# Patient Record
Sex: Female | Born: 1960 | Race: Black or African American | Hispanic: No | Marital: Married | Smoking: Never smoker
Health system: Southern US, Community
[De-identification: ages and names within clinical notes are randomized; demographics above are authoritative.]

## PROBLEM LIST (undated history)

## (undated) DIAGNOSIS — I10 Essential (primary) hypertension: Secondary | ICD-10-CM

## (undated) HISTORY — DX: Essential (primary) hypertension: I10

## (undated) HISTORY — PX: NO PAST SURGERIES: SHX2092

---

## 1999-07-06 ENCOUNTER — Other Ambulatory Visit: Admission: RE | Admit: 1999-07-06 | Discharge: 1999-07-06 | Payer: Self-pay | Admitting: Obstetrics & Gynecology

## 2002-04-05 ENCOUNTER — Other Ambulatory Visit: Admission: RE | Admit: 2002-04-05 | Discharge: 2002-04-05 | Payer: Self-pay | Admitting: Obstetrics & Gynecology

## 2003-08-04 ENCOUNTER — Other Ambulatory Visit: Admission: RE | Admit: 2003-08-04 | Discharge: 2003-08-04 | Payer: Self-pay | Admitting: Obstetrics & Gynecology

## 2004-06-30 ENCOUNTER — Ambulatory Visit (HOSPITAL_COMMUNITY): Admission: RE | Admit: 2004-06-30 | Discharge: 2004-06-30 | Payer: Self-pay | Admitting: Obstetrics & Gynecology

## 2004-11-23 ENCOUNTER — Other Ambulatory Visit: Admission: RE | Admit: 2004-11-23 | Discharge: 2004-11-23 | Payer: Self-pay | Admitting: Obstetrics & Gynecology

## 2006-02-08 ENCOUNTER — Other Ambulatory Visit: Admission: RE | Admit: 2006-02-08 | Discharge: 2006-02-08 | Payer: Self-pay | Admitting: Obstetrics & Gynecology

## 2006-03-29 IMAGING — US US OB COMP LESS 14 WK
1 series · 18 of 28 positions shown · non-contrast
Comparison: none

CLINICAL DATA: 43-year-old with LMP in mid [place].  
EARLY OBSTETRICAL ULTRASOUND WITH TRANSVAGINAL:
Transabdominal and endovaginal images are performed of the pelvis.
Within the uterus there is a gestational sac with a mean sac diameter of 12.8 mm.  This correlates with an age of 6 weeks 0 days.  No yolk sac or embryo are identified at this time.  Small areas of subchorionic hemorrhage are identified.
Right ovary is 2.4 x 1.2 x 1.7 cm.  The left ovary is 1.4 x 2.4 x 1.4 cm.  No free pelvic fluid is identified.  
IMPRESSION
Intrauterine gestational sac with mean sac diameter of 12.8 mm.  indings are suspicious for an anembryonic pregnancy.  Follow-up ultrasound in one week may be helpful.

[Series 1: us ob comp<14 wk · 18 of 43 slices shown]
[im 1/43]
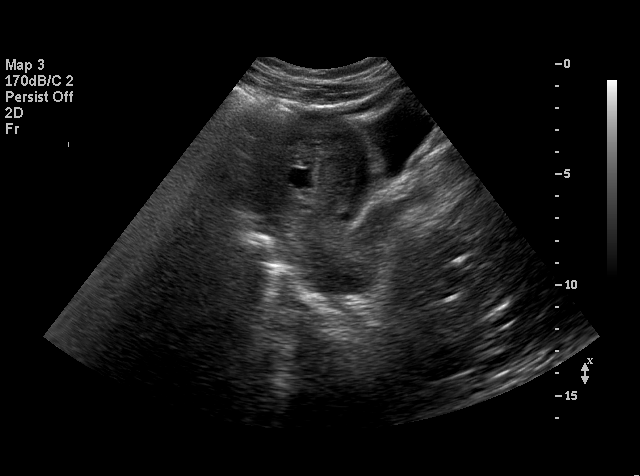
[im 4/43]
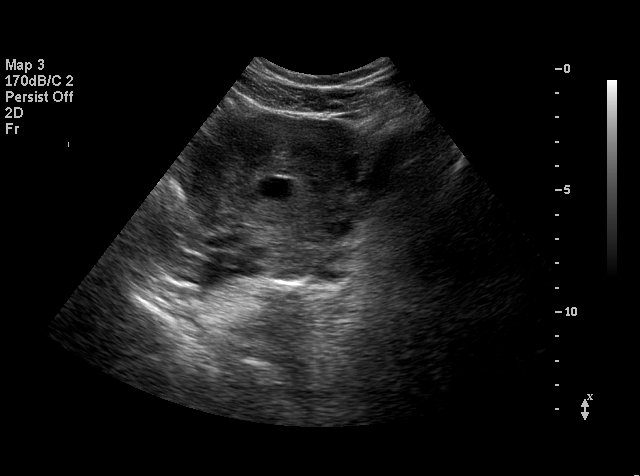
[im 5/43]
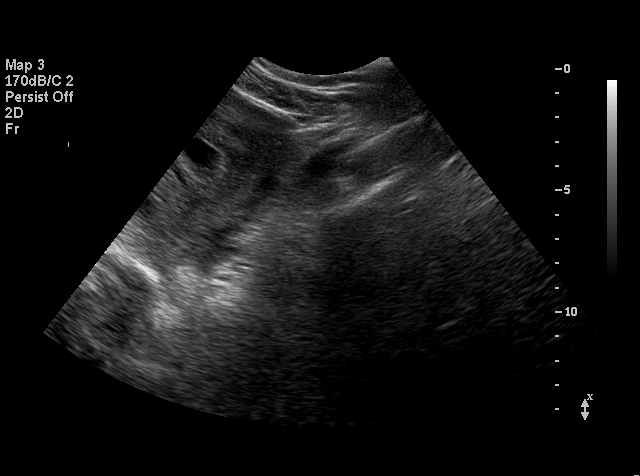
[im 8/43]
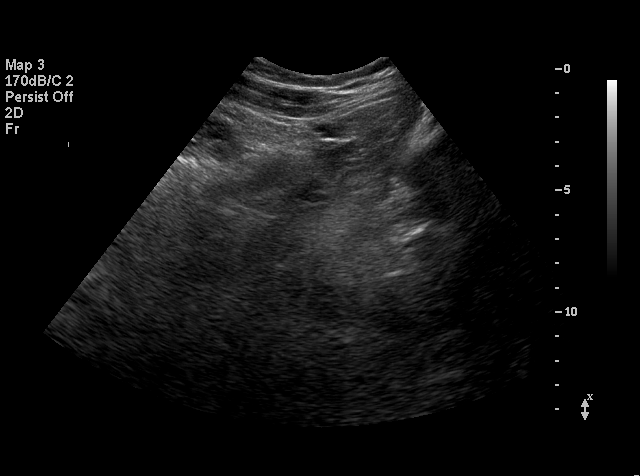
[im 11/43]
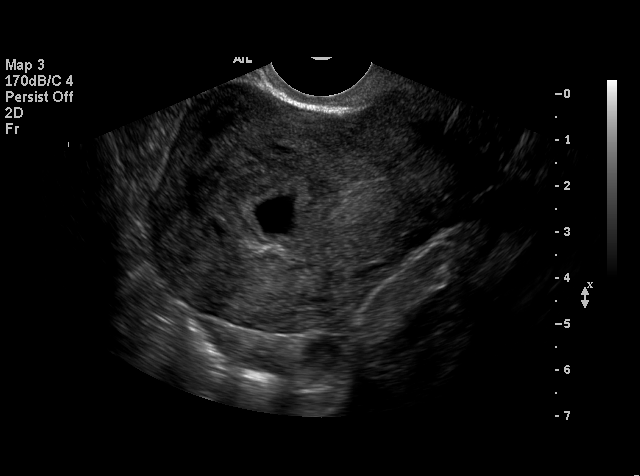
[im 13/43]
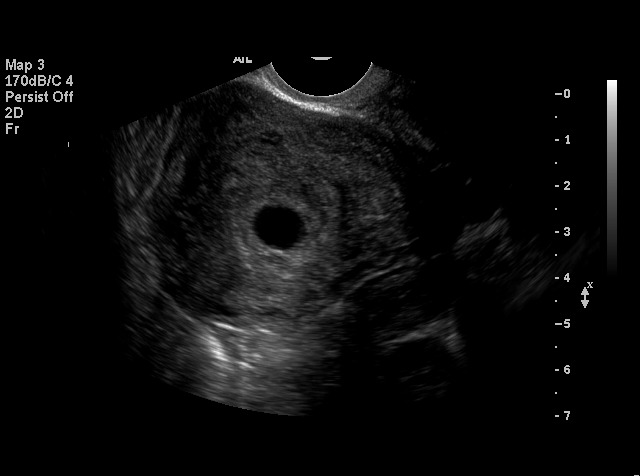
[im 16/43]
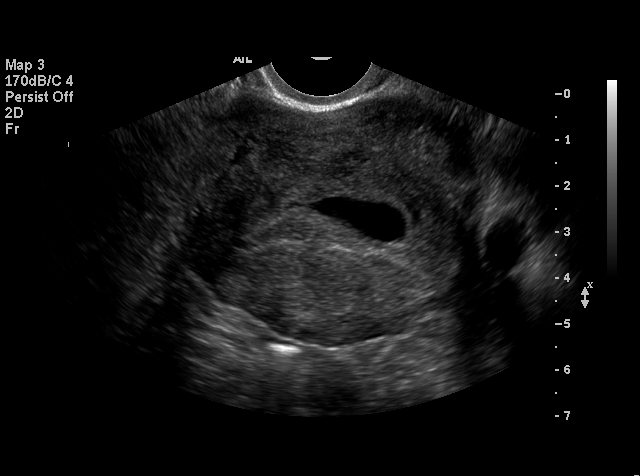
[im 18/43]
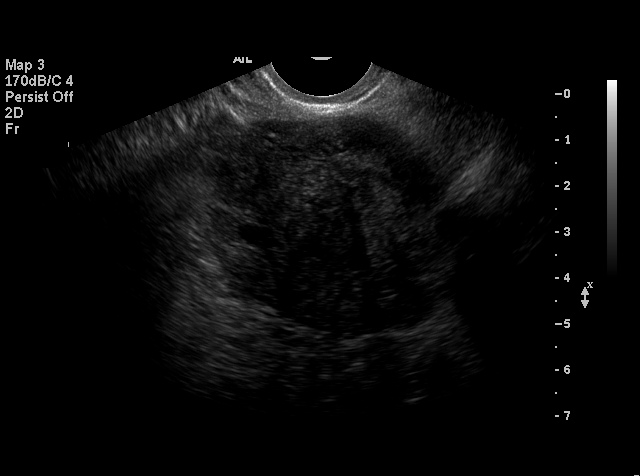
[im 21/43]
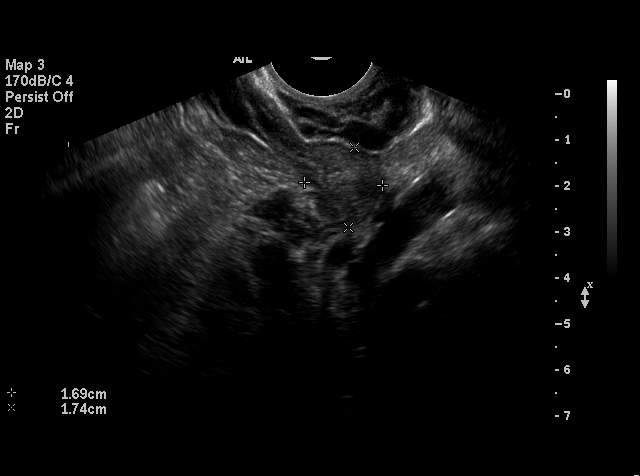
[im 22/43]
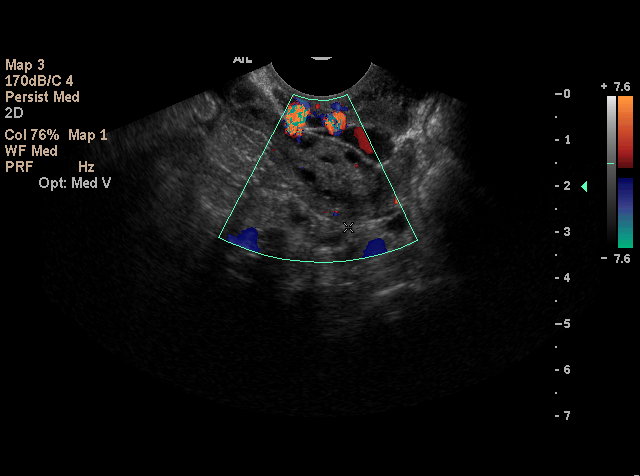
[im 25/43]
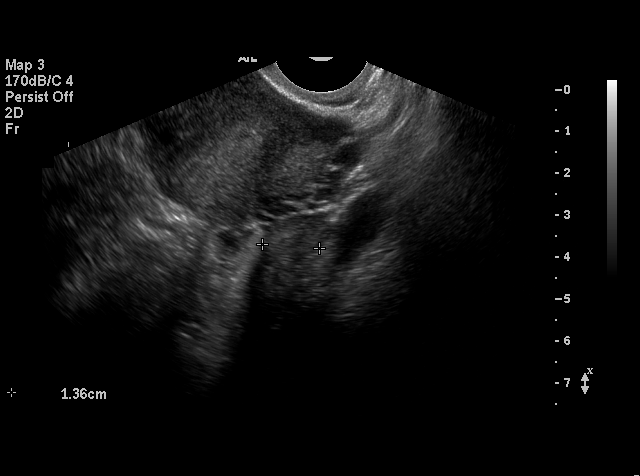
[im 27/43]
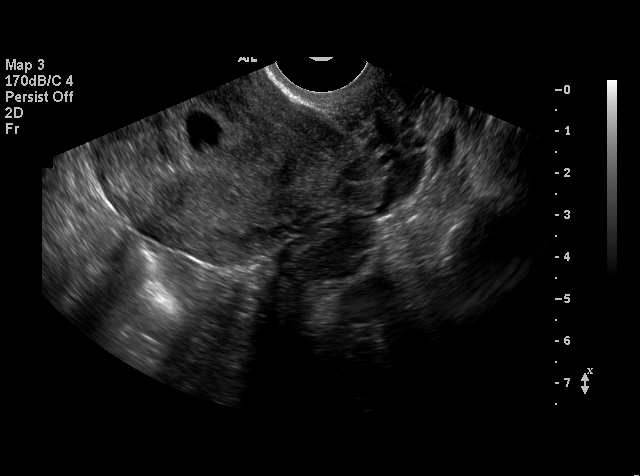
[im 30/43]
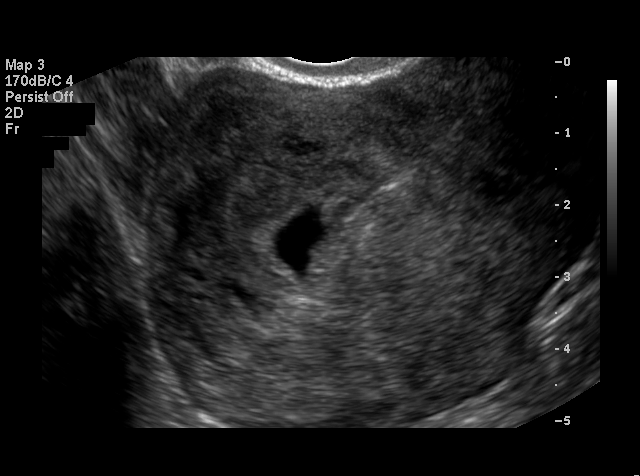
[im 33/43]
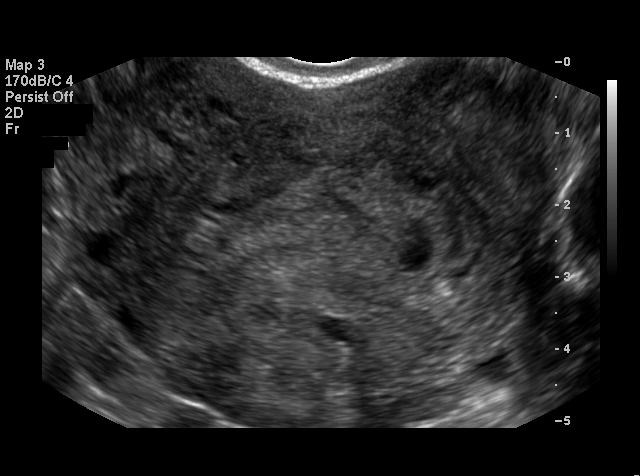
[im 35/43]
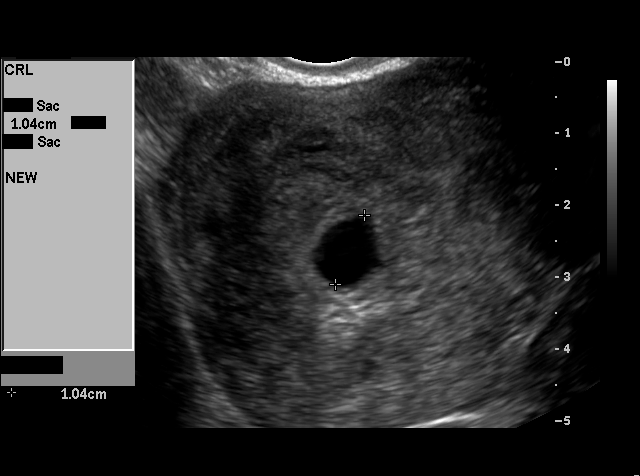
[im 38/43]
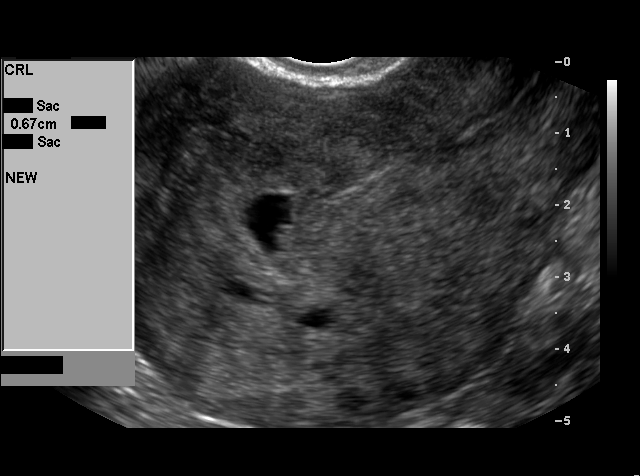
[im 39/43]
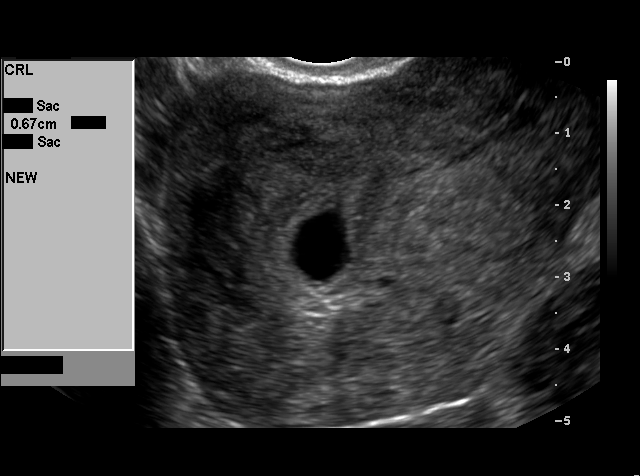
[im 43/43]
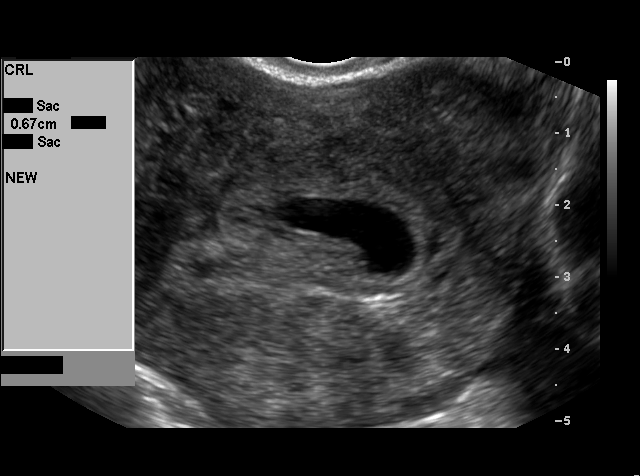

[18 of 28 positions shown; findings below may reference images not displayed]

## 2010-08-04 ENCOUNTER — Encounter: Admission: RE | Admit: 2010-08-04 | Discharge: 2010-08-04 | Payer: Self-pay | Admitting: Obstetrics & Gynecology

## 2010-12-12 ENCOUNTER — Encounter: Payer: Self-pay | Admitting: Obstetrics & Gynecology

## 2013-07-04 ENCOUNTER — Encounter: Payer: Self-pay | Admitting: Gastroenterology

## 2013-08-16 ENCOUNTER — Ambulatory Visit (AMBULATORY_SURGERY_CENTER): Payer: Self-pay | Admitting: *Deleted

## 2013-08-16 VITALS — Ht 63.0 in | Wt 153.8 lb

## 2013-08-16 DIAGNOSIS — Z1211 Encounter for screening for malignant neoplasm of colon: Secondary | ICD-10-CM

## 2013-08-16 MED ORDER — MOVIPREP 100 G PO SOLR: 1.0000 | Freq: Once | ORAL | Status: DC

## 2013-08-16 NOTE — Progress Notes (Signed)
Denies allergies to eggs or soy products. Denies complications with sedation or anesthesia. 

## 2013-08-19 ENCOUNTER — Encounter: Payer: Self-pay | Admitting: Gastroenterology

## 2013-08-30 ENCOUNTER — Encounter: Payer: BC Managed Care – PPO | Admitting: Gastroenterology

## 2013-10-11 ENCOUNTER — Encounter: Payer: BC Managed Care – PPO | Admitting: Internal Medicine

## 2013-11-08 ENCOUNTER — Encounter: Payer: BC Managed Care – PPO | Admitting: Internal Medicine

## 2015-01-02 ENCOUNTER — Encounter: Payer: Self-pay | Admitting: Internal Medicine

## 2015-03-13 ENCOUNTER — Ambulatory Visit (AMBULATORY_SURGERY_CENTER): Payer: Self-pay | Admitting: *Deleted

## 2015-03-13 VITALS — Ht 63.5 in | Wt 151.0 lb

## 2015-03-13 DIAGNOSIS — Z1211 Encounter for screening for malignant neoplasm of colon: Secondary | ICD-10-CM

## 2015-03-13 MED ORDER — SOD PICOSULFATE-MAG OX-CIT ACD 10-3.5-12 MG-GM-GM PO PACK: PACK | ORAL | Status: DC

## 2015-03-13 NOTE — Progress Notes (Signed)
Patient denies any allergies to eggs or soy. Patient denies any past surgeries.  Patient denies any oxygen use at home and does not take any diet/weight loss medications. EMMI education assisgned to patient on colonoscopy, this was explained and instructions given to patient. Prep-o-pik was requested by patient, instructions given. Patient denies constipation. Patient cancelled last colonoscopy after pre-visit because she didn't want to drink the Movi-prep.

## 2015-03-27 ENCOUNTER — Encounter: Payer: Self-pay | Admitting: Internal Medicine

## 2015-03-27 ENCOUNTER — Other Ambulatory Visit: Payer: Self-pay | Admitting: Internal Medicine

## 2015-03-27 ENCOUNTER — Ambulatory Visit (AMBULATORY_SURGERY_CENTER): Payer: BLUE CROSS/BLUE SHIELD | Admitting: Internal Medicine

## 2015-03-27 VITALS — BP 111/76 | HR 67 | Temp 96.2°F | Resp 17 | Ht 63.0 in | Wt 153.0 lb

## 2015-03-27 DIAGNOSIS — D124 Benign neoplasm of descending colon: Secondary | ICD-10-CM

## 2015-03-27 DIAGNOSIS — Z1211 Encounter for screening for malignant neoplasm of colon: Secondary | ICD-10-CM | POA: Diagnosis not present

## 2015-03-27 MED ORDER — SODIUM CHLORIDE 0.9 % IV SOLN: 500.0000 mL | INTRAVENOUS | Status: DC

## 2015-03-27 NOTE — Patient Instructions (Addendum)
I found and removed one polyp that looks benign.  I will let you know pathology results and when to have another routine colonoscopy by mail.  I appreciate the opportunity to care for you. Gatha Mayer, MD, FACG   YOU HAD AN ENDOSCOPIC PROCEDURE TODAY AT [facility]:   Refer to the procedure report that was given to you for any specific questions about what was found during the examination.  If the procedure report does not answer your questions, please call your gastroenterologist to clarify.  If you requested that your care partner not be given the details of your procedure findings, then the procedure report has been included in a sealed envelope for you to review at your convenience later.  YOU SHOULD EXPECT: Some feelings of bloating in the abdomen. Passage of more gas than usual.  Walking can help get rid of the air that was put into your GI tract during the procedure and reduce the bloating. If you had a lower endoscopy (such as a colonoscopy or flexible sigmoidoscopy) you may notice spotting of blood in your stool or on the toilet paper. If you underwent a bowel prep for your procedure, you may not have a normal bowel movement for a few days.  Please Note:  You might notice some irritation and congestion in your nose or some drainage.  This is from the oxygen used during your procedure.  There is no need for concern and it should clear up in a day or so.  SYMPTOMS TO REPORT IMMEDIATELY:   Following lower endoscopy (colonoscopy or flexible sigmoidoscopy):  Excessive amounts of blood in the stool  Significant tenderness or worsening of abdominal pains  Swelling of the abdomen that is new, acute  Fever of 100F or higher   For urgent or emergent issues, a gastroenterologist can be reached at any hour by calling [phone].   DIET: Your first meal following the procedure should be a small meal and then it is ok to progress to your normal diet. Heavy or  fried foods are harder to digest and may make you feel nauseous or bloated.  Likewise, meals heavy in dairy and vegetables can increase bloating.  Drink plenty of fluids but you should avoid alcoholic beverages for 24 hours.  ACTIVITY:  You should plan to take it easy for the rest of today and you should NOT DRIVE or use heavy machinery until tomorrow (because of the sedation medicines used during the test).    FOLLOW UP: Our staff will call the number listed on your records the next business day following your procedure to check on you and address any questions or concerns that you may have regarding the information given to you following your procedure. If we do not reach you, we will leave a message.  However, if you are feeling well and you are not experiencing any problems, there is no need to return our call.  We will assume that you have returned to your regular daily activities without incident.  If any biopsies were taken you will be contacted by phone or by letter within the next 1-3 weeks.  Please call us at [phone] if you have not heard about the biopsies in 3 weeks.    SIGNATURES/CONFIDENTIALITY: You and/or your care partner have signed paperwork which will be entered into your electronic medical record.  These signatures attest to the fact that that the information above on your After Visit Summary has been reviewed and  is understood.  Full responsibility of the confidentiality of this discharge information lies with you and/or your care-partner.  HOLD ALL ASPIRIN AND NSAIDS FOR TWO WEEKS TO PREVENT BLEEDING.  READ ALL HANDOUTS GIVEN TO YO BY YOUR RECOVERY ROOM NURSE.

## 2015-03-27 NOTE — Progress Notes (Signed)
Report to PACU, RN, vss, BBS= Clear.  

## 2015-03-27 NOTE — Progress Notes (Signed)
Called to room to assist during endoscopic procedure.  Patient ID and intended procedure confirmed with present staff. Received instructions for my participation in the procedure from the performing physician.  

## 2015-03-27 NOTE — Op Note (Signed)
[place]  [address] [place], [address]   COLONOSCOPY PROCEDURE REPORT  PATIENT: Vanessa Graham, Vanessa Graham  MR#: [id number] BIRTHDATE: 09/26/1961 , 54  yrs. old GENDER: female ENDOSCOPIST: Gatha Mayer, MD, [facility] PROCEDURE DATE:  03/27/2015 PROCEDURE:   Colonoscopy, screening and Colonoscopy with snare polypectomy First Screening Colonoscopy - Avg.  risk and is 50 yrs.  old or older Yes.  Prior Negative Screening - Now for repeat screening. N/A  History of Adenoma - Now for follow-up colonoscopy & has been > or = to 3 yrs.  N/A  Polyps Removed Today ASA CLASS:   Class II INDICATIONS:Screening for colonic neoplasia and Colorectal Neoplasm Risk Assessment for this procedure is average risk. MEDICATIONS: Propofol 300 mg IV and Monitored anesthesia care  DESCRIPTION OF PROCEDURE:   After the risks benefits and alternatives of the procedure were thoroughly explained, informed consent was obtained.  The digital rectal exam revealed no abnormalities of the rectum.   The LB CF-H180AL Loaner [id number] endoscope was introduced through the anus and advanced to the cecum, which was identified by both the appendix and ileocecal valve. No adverse events experienced.   The quality of the prep was excellent.  (MiraLax was used)  The instrument was then slowly withdrawn as the colon was fully examined.      COLON FINDINGS: A semi-pedunculated polyp measuring 12 mm in size was found in the descending colon.  A polypectomy was performed using snare cautery.  The resection was complete, the polyp tissue was completely retrieved and sent to histology.   The examination was otherwise normal.  Retroflexed views revealed no abnormalities. The time to cecum = 5.9 Withdrawal time = 11.8   The scope was withdrawn and the procedure completed. COMPLICATIONS: There were no immediate complications.  ENDOSCOPIC IMPRESSION: 1.   Semi-pedunculated polyp was found in the descending  colon; polypectomy was performed using snare cautery 2.   The examination was otherwise normal  RECOMMENDATIONS: 1.  Hold Aspirin and all other NSAIDS for 2 weeks. 2.  Timing of repeat colonoscopy will be determined by pathology findings.  eSigned:  Gatha Mayer, MD, [facility] 03/27/2015 11:05 AM   cc: Janie Morning, MD and The Patient

## 2015-03-30 ENCOUNTER — Telehealth: Payer: Self-pay | Admitting: *Deleted

## 2015-03-30 NOTE — Telephone Encounter (Signed)
  Follow up Call-  Call back number 03/27/2015  Post procedure Call Back phone  # [phone]  Permission to leave phone message Yes     Patient questions:  Message left to call us if necessary.

## 2015-04-01 ENCOUNTER — Encounter: Payer: Self-pay | Admitting: Internal Medicine

## 2015-04-01 NOTE — Progress Notes (Signed)
Quick Note:  Benign mucosal polyp - repeat colonoscopy 2026 ______

## 2017-08-01 DIAGNOSIS — Z0000 Encounter for general adult medical examination without abnormal findings: Secondary | ICD-10-CM | POA: Diagnosis not present

## 2017-08-01 DIAGNOSIS — I10 Essential (primary) hypertension: Secondary | ICD-10-CM | POA: Diagnosis not present

## 2017-08-17 DIAGNOSIS — Z1389 Encounter for screening for other disorder: Secondary | ICD-10-CM | POA: Diagnosis not present

## 2017-08-17 DIAGNOSIS — R3129 Other microscopic hematuria: Secondary | ICD-10-CM | POA: Diagnosis not present

## 2017-08-17 DIAGNOSIS — Z23 Encounter for immunization: Secondary | ICD-10-CM | POA: Diagnosis not present

## 2017-08-17 DIAGNOSIS — I10 Essential (primary) hypertension: Secondary | ICD-10-CM | POA: Diagnosis not present

## 2017-08-17 DIAGNOSIS — Z0000 Encounter for general adult medical examination without abnormal findings: Secondary | ICD-10-CM | POA: Diagnosis not present

## 2017-09-14 DIAGNOSIS — Z1231 Encounter for screening mammogram for malignant neoplasm of breast: Secondary | ICD-10-CM | POA: Diagnosis not present

## 2017-09-14 DIAGNOSIS — Z6828 Body mass index (BMI) 28.0-28.9, adult: Secondary | ICD-10-CM | POA: Diagnosis not present

## 2017-09-14 DIAGNOSIS — Z124 Encounter for screening for malignant neoplasm of cervix: Secondary | ICD-10-CM | POA: Diagnosis not present

## 2017-09-14 DIAGNOSIS — N939 Abnormal uterine and vaginal bleeding, unspecified: Secondary | ICD-10-CM | POA: Diagnosis not present

## 2017-09-14 DIAGNOSIS — Z01419 Encounter for gynecological examination (general) (routine) without abnormal findings: Secondary | ICD-10-CM | POA: Diagnosis not present

## 2018-01-30 DIAGNOSIS — M25561 Pain in right knee: Secondary | ICD-10-CM | POA: Diagnosis not present

## 2018-03-06 DIAGNOSIS — M25561 Pain in right knee: Secondary | ICD-10-CM | POA: Diagnosis not present

## 2018-08-23 DIAGNOSIS — R82998 Other abnormal findings in urine: Secondary | ICD-10-CM | POA: Diagnosis not present

## 2018-08-24 DIAGNOSIS — Z0000 Encounter for general adult medical examination without abnormal findings: Secondary | ICD-10-CM | POA: Diagnosis not present

## 2018-08-30 DIAGNOSIS — Z0000 Encounter for general adult medical examination without abnormal findings: Secondary | ICD-10-CM | POA: Diagnosis not present

## 2018-08-30 DIAGNOSIS — I10 Essential (primary) hypertension: Secondary | ICD-10-CM | POA: Diagnosis not present

## 2018-08-30 DIAGNOSIS — Z23 Encounter for immunization: Secondary | ICD-10-CM | POA: Diagnosis not present

## 2018-08-30 DIAGNOSIS — Z1389 Encounter for screening for other disorder: Secondary | ICD-10-CM | POA: Diagnosis not present

## 2018-08-30 DIAGNOSIS — Z6827 Body mass index (BMI) 27.0-27.9, adult: Secondary | ICD-10-CM | POA: Diagnosis not present

## 2018-10-24 DIAGNOSIS — Z6827 Body mass index (BMI) 27.0-27.9, adult: Secondary | ICD-10-CM | POA: Diagnosis not present

## 2018-10-24 DIAGNOSIS — Z1231 Encounter for screening mammogram for malignant neoplasm of breast: Secondary | ICD-10-CM | POA: Diagnosis not present

## 2018-10-24 DIAGNOSIS — Z01419 Encounter for gynecological examination (general) (routine) without abnormal findings: Secondary | ICD-10-CM | POA: Diagnosis not present

## 2018-11-16 DIAGNOSIS — K921 Melena: Secondary | ICD-10-CM | POA: Diagnosis not present

## 2018-11-16 DIAGNOSIS — Z6827 Body mass index (BMI) 27.0-27.9, adult: Secondary | ICD-10-CM | POA: Diagnosis not present

## 2018-11-16 DIAGNOSIS — R159 Full incontinence of feces: Secondary | ICD-10-CM | POA: Diagnosis not present

## 2019-06-04 ENCOUNTER — Other Ambulatory Visit: Payer: Self-pay | Admitting: *Deleted

## 2019-06-04 DIAGNOSIS — Z20822 Contact with and (suspected) exposure to covid-19: Secondary | ICD-10-CM

## 2019-06-09 LAB — NOVEL CORONAVIRUS, NAA: SARS-CoV-2, NAA: NOT DETECTED

## 2019-06-11 ENCOUNTER — Telehealth: Payer: Self-pay | Admitting: Internal Medicine

## 2019-06-11 NOTE — Telephone Encounter (Signed)
Pt was given covid-19(Not detected) result/ Pt verbalized understanding

## 2019-08-27 DIAGNOSIS — R7989 Other specified abnormal findings of blood chemistry: Secondary | ICD-10-CM | POA: Diagnosis not present

## 2019-08-27 DIAGNOSIS — I10 Essential (primary) hypertension: Secondary | ICD-10-CM | POA: Diagnosis not present

## 2019-08-27 DIAGNOSIS — Z0000 Encounter for general adult medical examination without abnormal findings: Secondary | ICD-10-CM | POA: Diagnosis not present

## 2019-09-03 DIAGNOSIS — Z0000 Encounter for general adult medical examination without abnormal findings: Secondary | ICD-10-CM | POA: Diagnosis not present

## 2019-09-03 DIAGNOSIS — I10 Essential (primary) hypertension: Secondary | ICD-10-CM | POA: Diagnosis not present

## 2019-09-03 DIAGNOSIS — M79645 Pain in left finger(s): Secondary | ICD-10-CM | POA: Diagnosis not present

## 2019-09-03 DIAGNOSIS — R82998 Other abnormal findings in urine: Secondary | ICD-10-CM | POA: Diagnosis not present

## 2019-09-03 DIAGNOSIS — Z1331 Encounter for screening for depression: Secondary | ICD-10-CM | POA: Diagnosis not present

## 2019-09-03 DIAGNOSIS — R635 Abnormal weight gain: Secondary | ICD-10-CM | POA: Diagnosis not present

## 2020-01-22 DIAGNOSIS — Z113 Encounter for screening for infections with a predominantly sexual mode of transmission: Secondary | ICD-10-CM | POA: Diagnosis not present

## 2020-01-22 DIAGNOSIS — Z01419 Encounter for gynecological examination (general) (routine) without abnormal findings: Secondary | ICD-10-CM | POA: Diagnosis not present

## 2020-01-22 DIAGNOSIS — Z6828 Body mass index (BMI) 28.0-28.9, adult: Secondary | ICD-10-CM | POA: Diagnosis not present

## 2020-01-22 DIAGNOSIS — N760 Acute vaginitis: Secondary | ICD-10-CM | POA: Diagnosis not present

## 2020-01-24 ENCOUNTER — Ambulatory Visit: Payer: BC Managed Care – PPO | Attending: Internal Medicine

## 2020-01-24 DIAGNOSIS — Z23 Encounter for immunization: Secondary | ICD-10-CM | POA: Insufficient documentation

## 2020-01-24 NOTE — Progress Notes (Signed)
   Covid-19 Vaccination Clinic  Name:  Vanessa Graham    MRN: [id number] DOB: Oct 02, 1961  01/24/2020  Vanessa Graham was observed post Covid-19 immunization for 15 minutes without incident. She was provided with Vaccine Information Sheet and instruction to access the V-Safe system.   Vanessa Graham was instructed to call 911 with any severe reactions post vaccine: Marland Kitchen Difficulty breathing  . Swelling of face and throat  . A fast heartbeat  . A bad rash all over body  . Dizziness and weakness

## 2020-02-10 DIAGNOSIS — Z1231 Encounter for screening mammogram for malignant neoplasm of breast: Secondary | ICD-10-CM | POA: Diagnosis not present

## 2020-02-25 ENCOUNTER — Ambulatory Visit: Payer: BC Managed Care – PPO | Attending: Internal Medicine

## 2020-02-25 DIAGNOSIS — Z23 Encounter for immunization: Secondary | ICD-10-CM

## 2020-02-25 NOTE — Progress Notes (Signed)
   Covid-19 Vaccination Clinic  Name:  Vanessa Graham    MRN: [id number] DOB: 05/04/1961  02/25/2020  Vanessa Graham was observed post Covid-19 immunization for 15 minutes without incident. She was provided with Vaccine Information Sheet and instruction to access the V-Safe system.   Vanessa Graham was instructed to call 911 with any severe reactions post vaccine: Marland Kitchen Difficulty breathing  . Swelling of face and throat  . A fast heartbeat  . A bad rash all over body  . Dizziness and weakness   Immunizations Administered    Name Date Dose VIS Date Route   Pfizer COVID-19 Vaccine 02/25/2020  9:25 AM 0.3 mL 11/01/2019 Intramuscular   Manufacturer: [employer], [employer]   Lot: [id number]   [place]: [id number]

## 2020-03-31 ENCOUNTER — Other Ambulatory Visit: Payer: Self-pay

## 2020-03-31 ENCOUNTER — Encounter: Payer: Self-pay | Admitting: Plastic Surgery

## 2020-03-31 ENCOUNTER — Ambulatory Visit (INDEPENDENT_AMBULATORY_CARE_PROVIDER_SITE_OTHER): Payer: Self-pay | Admitting: Plastic Surgery

## 2020-03-31 DIAGNOSIS — S01319A Laceration without foreign body of unspecified ear, initial encounter: Secondary | ICD-10-CM | POA: Insufficient documentation

## 2020-03-31 DIAGNOSIS — S01312A Laceration without foreign body of left ear, initial encounter: Secondary | ICD-10-CM

## 2020-03-31 NOTE — Progress Notes (Signed)
   Subjective:    Patient ID: Vanessa Graham, female    DOB: [birth year], 59 y.o.   MRN: [id number]  The patient is a 59 year old bf here for evaluation of her earlobes.  The patient states she had a tear of her left earlobe when 3 years ago.  This was repaired by another Psychologist, sport and exercise.  She thinks it was repaired primarily without a Z-plasty.  Shortly after the repair it broke through again.  She would like to be able to wear her earrings and would like it repaired.  We talked about the way in which it needs to be repaired in order for her to wear her earrings again.  There is no sign of infection at this point in time and the care has reepithelialized.     Review of Systems  Constitutional: Negative.  Negative for activity change and appetite change.  Eyes: Negative.   Respiratory: Negative.   Cardiovascular: Negative.   Gastrointestinal: Negative.   Endocrine: Negative.   Genitourinary: Negative.   Musculoskeletal: Negative.        Objective:   Physical Exam Vitals and nursing note reviewed.  Constitutional:      Appearance: Normal appearance.  HENT:     Head: Normocephalic.     Ears:   Cardiovascular:     Rate and Rhythm: Normal rate.     Pulses: Normal pulses.  Neurological:     General: No focal deficit present.     Mental Status: She is alert and oriented to person, place, and time.  Psychiatric:        Mood and Affect: Mood normal.        Behavior: Behavior normal.        Thought Content: Thought content normal.       Assessment & Plan:     ICD-10-CM   1. Torn left ear lobe, initial encounter  S01.312A     Recommend repair with a Z plasty. This would require three months of no earrings.  Then the lobe could be pierced again. Pictures were obtained of the patient and placed in the chart with the patient's or guardian's permission.

## 2020-06-12 ENCOUNTER — Ambulatory Visit: Payer: Self-pay | Admitting: Plastic Surgery

## 2020-06-12 ENCOUNTER — Other Ambulatory Visit: Payer: Self-pay

## 2020-06-12 ENCOUNTER — Encounter: Payer: Self-pay | Admitting: Plastic Surgery

## 2020-06-12 VITALS — BP 104/71 | HR 73 | Temp 98.0°F

## 2020-06-12 DIAGNOSIS — S01312A Laceration without foreign body of left ear, initial encounter: Secondary | ICD-10-CM

## 2020-06-12 NOTE — Progress Notes (Signed)
Postponed due to in office emergency.

## 2020-06-24 ENCOUNTER — Ambulatory Visit (INDEPENDENT_AMBULATORY_CARE_PROVIDER_SITE_OTHER): Payer: Self-pay | Admitting: Plastic Surgery

## 2020-06-24 ENCOUNTER — Other Ambulatory Visit: Payer: Self-pay

## 2020-06-24 ENCOUNTER — Encounter: Payer: Self-pay | Admitting: Plastic Surgery

## 2020-06-24 VITALS — BP 111/73 | HR 73 | Temp 98.3°F | Ht 64.0 in | Wt 160.0 lb

## 2020-06-24 DIAGNOSIS — S01312A Laceration without foreign body of left ear, initial encounter: Secondary | ICD-10-CM

## 2020-06-24 NOTE — Progress Notes (Signed)
Preoperative Dx: left split ear lobe  Postoperative Dx: left split ear lobe  Procedure: repair of left split ear lobe  Anesthesia: Lidocaine 1% with 1:100,000 epinepherine  Indication for Procedure: split ear lobes  Description of Procedure: Risks and complications were explained to the patient. Consent was confirmed. Time out was called and all information was confirmed to be correct. The ear lobe was prepped with betadine and drapped. A z plasty type mark was made and then lidocaine 1% with epinepherine was injected in the subcutaneous area. After waiting several minutes for the lidocaine to take affect an #11 blade was used to make the incisions which included cutting out the previous epithelialized hole. A 6-0 Monocryl was used to reapproximate the deep tissue. The skin edges were reapproximated with 6-0 Monocryl interrupted sutures. Steri strips were applied. The patient is to follow up in one week. She tolerated the procedure well and there were no complications. Risks and complicatins were discussed and consent signed.

## 2020-07-10 ENCOUNTER — Encounter: Payer: Self-pay | Admitting: Plastic Surgery

## 2020-07-10 ENCOUNTER — Other Ambulatory Visit: Payer: Self-pay

## 2020-07-10 ENCOUNTER — Ambulatory Visit (INDEPENDENT_AMBULATORY_CARE_PROVIDER_SITE_OTHER): Payer: BC Managed Care – PPO | Admitting: Plastic Surgery

## 2020-07-10 VITALS — BP 111/77 | HR 80 | Temp 97.9°F

## 2020-07-10 DIAGNOSIS — S01312A Laceration without foreign body of left ear, initial encounter: Secondary | ICD-10-CM

## 2020-07-10 NOTE — Progress Notes (Signed)
The patient is a lovely 59 year old female here for follow-up on her left torn earlobe repair.  This was done a little over a week ago.  The Steri-Strip has stayed in place.  I removed it today.  There is no sign of infection.  The incision is healing nicely.  Sutures are in place.  Due to the Dermabond I think I might disrupted if I try to pull out the stitches.  I replaced the Steri-Strips and am going to wait until next week to remove the sutures.  The patient is in agreement.

## 2020-07-20 NOTE — Progress Notes (Signed)
Patient is a 59 year old female here for follow-up after undergoing left torn earlobe repair with Dr. Marla Roe on 06/24/2020.  ~ 4 weeks PO Patient reports she is doing very well.  No concerns.  Steri-Strips removed.  Dermabond still in place.  Most of the suture knots removed; some are fully under the Dermabond and were left in place for now.  Incision is healing very nicely, C/D/I.  No signs of infection, redness, drainage, seroma/hematoma.  May apply Vaseline to the earlobe.  May shower normally.  Dermabond should fall off within the next few weeks.  If any suture knots remain she may return to have them removed.  Wait 3 months after surgery to have ear repierced.  Denies fever/chills, nausea/vomiting.  Follow-up as needed.  Call office with any questions/concerns.  The [place] was signed into law in 2016 which includes the topic of electronic health records.  This provides immediate access to information in MyChart.  This includes consultation notes, operative notes, office notes, lab results and pathology reports.  If you have any questions about what you read please let us know at your next visit or call us at the office.  We are right here with you.

## 2020-07-21 ENCOUNTER — Ambulatory Visit (INDEPENDENT_AMBULATORY_CARE_PROVIDER_SITE_OTHER): Payer: Self-pay | Admitting: Plastic Surgery

## 2020-07-21 ENCOUNTER — Encounter: Payer: Self-pay | Admitting: Plastic Surgery

## 2020-07-21 ENCOUNTER — Other Ambulatory Visit: Payer: Self-pay

## 2020-07-21 VITALS — BP 113/78 | HR 77 | Temp 98.0°F

## 2020-07-21 DIAGNOSIS — S01312D Laceration without foreign body of left ear, subsequent encounter: Secondary | ICD-10-CM

## 2020-09-01 DIAGNOSIS — Z0000 Encounter for general adult medical examination without abnormal findings: Secondary | ICD-10-CM | POA: Diagnosis not present

## 2020-09-01 DIAGNOSIS — R7889 Finding of other specified substances, not normally found in blood: Secondary | ICD-10-CM | POA: Diagnosis not present

## 2020-09-08 DIAGNOSIS — R82998 Other abnormal findings in urine: Secondary | ICD-10-CM | POA: Diagnosis not present

## 2020-09-08 DIAGNOSIS — Z0000 Encounter for general adult medical examination without abnormal findings: Secondary | ICD-10-CM | POA: Diagnosis not present

## 2020-09-08 DIAGNOSIS — I10 Essential (primary) hypertension: Secondary | ICD-10-CM | POA: Diagnosis not present

## 2020-10-14 DIAGNOSIS — Z1212 Encounter for screening for malignant neoplasm of rectum: Secondary | ICD-10-CM | POA: Diagnosis not present

## 2021-01-15 ENCOUNTER — Encounter: Payer: Self-pay | Admitting: Plastic Surgery

## 2021-01-15 ENCOUNTER — Ambulatory Visit (INDEPENDENT_AMBULATORY_CARE_PROVIDER_SITE_OTHER): Payer: Self-pay | Admitting: Plastic Surgery

## 2021-01-15 ENCOUNTER — Other Ambulatory Visit: Payer: Self-pay

## 2021-01-15 VITALS — BP 114/78 | HR 77 | Temp 97.3°F | Ht 64.0 in | Wt 164.0 lb

## 2021-01-15 DIAGNOSIS — S01312D Laceration without foreign body of left ear, subsequent encounter: Secondary | ICD-10-CM

## 2021-01-15 DIAGNOSIS — Z719 Counseling, unspecified: Secondary | ICD-10-CM

## 2021-01-15 NOTE — Progress Notes (Signed)
Procedure Note  Preoperative Dx: Left torn earlobe  Postoperative Dx: Same  Procedure: Piercing of left earlobe after repair of torn earlobe  Description of Procedure: Risks and complications were explained to the patient.  Consent was confirmed and the patient understands the risks and benefits.  The potential complications and alternatives were explained and the patient consents.  The patient expressed understanding the option of not having the procedure and the risks of a scar.  Time out was called and all information was confirmed to be correct.    The area was prepped.  The ear was marked and an earring was placed in the right ear for identification for symmetry.  The ear was marked with a purple marker and then mirror given to the patient to check for location.  The patient agreed.  The ear piercing tool was then utilized to pierce the ear.  There were no complications and the patient tolerated it very well. The patient was given instructions on how to care for the area and a follow up appointment.

## 2021-03-02 DIAGNOSIS — Z1231 Encounter for screening mammogram for malignant neoplasm of breast: Secondary | ICD-10-CM | POA: Diagnosis not present

## 2021-03-02 DIAGNOSIS — Z01419 Encounter for gynecological examination (general) (routine) without abnormal findings: Secondary | ICD-10-CM | POA: Diagnosis not present

## 2021-03-08 ENCOUNTER — Ambulatory Visit: Payer: BC Managed Care – PPO | Admitting: Podiatry

## 2021-03-08 ENCOUNTER — Other Ambulatory Visit: Payer: Self-pay

## 2021-03-08 ENCOUNTER — Ambulatory Visit (INDEPENDENT_AMBULATORY_CARE_PROVIDER_SITE_OTHER): Payer: BC Managed Care – PPO

## 2021-03-08 DIAGNOSIS — M79671 Pain in right foot: Secondary | ICD-10-CM

## 2021-03-08 DIAGNOSIS — M21619 Bunion of unspecified foot: Secondary | ICD-10-CM

## 2021-03-08 DIAGNOSIS — M79672 Pain in left foot: Secondary | ICD-10-CM

## 2021-03-08 NOTE — Patient Instructions (Signed)
Bunion A bunion (hallux valgus) is a bump that forms slowly on the inner side of the big toe joint. It occurs when the big toe turns toward the second toe. Bunions may be small at first, but they often get larger over time. They can make walking painful. What are the causes? This condition may be caused by:  Wearing narrow or pointed shoes that force the big toe to press against the other toes.  Abnormal foot development that causes the foot to roll inward.  Changes in the foot that are caused by certain diseases, such as rheumatoid arthritis or polio.  A foot injury. What increases the risk? The following factors may make you more likely to develop this condition:  Wearing shoes that squeeze the toes together.  Having certain diseases, such as: ? Rheumatoid arthritis. ? Polio. ? Cerebral palsy.  Having family members who have bunions.  Being born with abnormally shaped feet (a foot deformity), such as flat feet or low arches.  Doing activities that put a lot of pressure on the feet, such as ballet dancing. What are the signs or symptoms? The main symptom of this condition is a bump on your big toe that you can notice. Other symptoms may include:  Pain.  Redness and inflammation around your big toe.  Thick or hardened skin on your big toe or between your toes.  Stiffness or loss of motion in your big toe.  Trouble with walking.   How is this diagnosed? This condition may be diagnosed based on your symptoms, medical history, and activities. You may also have tests and imaging, such as:  X-rays. These allow your health care provider to check the position of the bones in your foot and look for damage to your joint. They also help your health care provider determine the severity of your bunion and the best way to treat it.  Joint aspiration. In this test, a sample of fluid is removed from the toe joint. This test may be done if you are in a lot of pain. It helps rule out  diseases that cause painful swelling of the joints, such as arthritis or gout. How is this treated? Treatment depends on the severity of your symptoms. The goal of treatment is to relieve symptoms and prevent your bunion from getting worse. Your health care provider may recommend:  Wearing shoes that have a wide toe box, or using bunion pads to cushion the affected area.  Taping your toes together to keep them in a normal position.  Placing a device inside your shoe (orthotic device) to help reduce pressure on your toe joint.  Taking medicine to ease pain and inflammation.  Putting ice or heat on the affected area.  Doing stretching exercises.  Surgery, for severe cases. Follow these instructions at home: Managing pain, stiffness, and swelling  If directed, put ice on the painful area. To do this: ? Put ice in a plastic bag. ? Place a towel between your skin and the bag. ? Leave the ice on for 20 minutes, 2-3 times a day. ? Remove the ice if your skin turns bright red. This is very important. If you cannot feel pain, heat, or cold, you have a greater risk of damage to the area.  If directed, apply heat to the affected area before you exercise. Use the heat source that your health care provider recommends, such as a moist heat pack or a heating pad. ? Place a towel between your skin and the   heat source. ? Leave the heat on for 20-30 minutes. ? Remove the heat if your skin turns bright red. This is especially important if you are unable to feel pain, heat, or cold. You have a greater risk of getting burned.      General instructions  Do exercises as told by your health care provider.  Support your toe joint with proper footwear, shoe padding, or taping as told by your health care provider.  Take over-the-counter and prescription medicines only as told by your health care provider.  Do not use any products that contain nicotine or tobacco, such as cigarettes, e-cigarettes, and  chewing tobacco. If you need help quitting, ask your health care provider.  Keep all follow-up visits. This is important. Contact a health care provider if:  Your symptoms get worse.  Your symptoms do not improve in 2 weeks. Get help right away if:  You have severe pain and trouble with walking. Summary  A bunion is a bump on the inner side of the big toe joint that forms when the big toe turns toward the second toe.  Bunions can make walking painful.  Treatment depends on the severity of your symptoms.  Support your toe joint with proper footwear, shoe padding, or taping as told by your health care provider. This information is not intended to replace advice given to you by your health care provider. Make sure you discuss any questions you have with your health care provider. Document Revised: 03/13/2020 Document Reviewed: 03/13/2020 Elsevier Patient Education  2021 Elsevier Inc.  

## 2021-03-09 NOTE — Progress Notes (Signed)
Subjective:   Patient ID: Vanessa Graham, female   DOB: 60 y.o.   MRN: [id number]   HPI 60 year old female presents the office today interested in possible "mini-bunion" surgery.  She states that she like to have the bunion sticks more for cosmetic reasons as they are not causing significant discomfort.  She had no recent treatment for the bunions.  She did have discomfort previously but that seems to have resolved.  She has no other concerns today.   Review of Systems  All other systems reviewed and are negative.  Past Medical History:  Diagnosis Date  . Hypertension     Past Surgical History:  Procedure Laterality Date  . NO PAST SURGERIES       Current Outpatient Medications:  .  estradiol (ESTRACE) 0.1 MG/GM vaginal cream, Place 1 g vaginally once a week., Disp: , Rfl:  .  losartan (COZAAR) 100 MG tablet, Take 100 mg by mouth daily., Disp: , Rfl:  .  losartan-hydrochlorothiazide (HYZAAR) 100-12.5 MG tablet, Take 1 tablet by mouth daily., Disp: , Rfl:   No Known Allergies        Objective:  Physical Exam  General: AAO x3, NAD  Dermatological: Skin is warm, dry and supple bilateral. There are no open sores, no preulcerative lesions, no rash or signs of infection present.  Vascular: Dorsalis Pedis artery and Posterior Tibial artery pedal pulses are 2/4 bilateral with immedate capillary fill time. There is no pain with calf compression, swelling, warmth, erythema.   Neruologic: Grossly intact via light touch bilateral.  Musculoskeletal: Moderate to severe bunions are present there is hypermobility present of the first ray.  There is no discomfort identified.  Flexor, extensor tendons appear to be intact.  MMT 5/5.  Muscular strength 5/5 in all groups tested bilateral.  Gait: Unassisted, Nonantalgic.       Assessment:   Asymptomatic bunions     Plan:  -Treatment options discussed including all alternatives, risks, and complications -Etiology of symptoms were  discussed -X-rays were obtained and reviewed with the patient.  Severe bunion is present.  No evidence of acute fracture. -We discussed both conservative as well as surgical treatment options.  She was interested in having "mini bunion" surgery.  I do not think that given the size of the bunion she is a candidate for treatment with invasive bunion surgery.  We discussed the DynaBunion (L[place]dius).  However given her lack of symptoms I would not recommend doing surgery for cosmetic reasons for the bunion.  However should it become symptomatic or if the deformity worsens, which it is not, would consider doing the surgery.  Trula Slade DPM

## 2021-04-12 ENCOUNTER — Ambulatory Visit: Payer: BC Managed Care – PPO

## 2021-04-12 NOTE — Progress Notes (Signed)
   Covid-19 Vaccination Clinic  Name:  Wisdom COHICK    MRN: [id number] DOB: [birth year]  04/12/2021  Ms. Mettler was observed post Covid-19 immunization for 15 minutes without incident. She was provided with Vaccine Information Sheet and instruction to access the V-Safe system.   Ms. Forney was instructed to call 911 with any severe reactions post vaccine: Marland Kitchen Difficulty breathing  . Swelling of face and throat  . A fast heartbeat  . A bad rash all over body  . Dizziness and weakness

## 2021-04-16 ENCOUNTER — Other Ambulatory Visit (HOSPITAL_BASED_OUTPATIENT_CLINIC_OR_DEPARTMENT_OTHER): Payer: Self-pay

## 2021-04-16 MED ORDER — PFIZER-BIONT COVID-19 VAC-TRIS 30 MCG/0.3ML IM SUSP
INTRAMUSCULAR | 0 refills | Status: AC
  Filled 2021-04-16: qty 0.3, 1d supply, fill #0

## 2021-06-04 ENCOUNTER — Encounter: Payer: Self-pay | Admitting: Plastic Surgery

## 2021-06-04 ENCOUNTER — Other Ambulatory Visit: Payer: Self-pay

## 2021-06-04 ENCOUNTER — Ambulatory Visit (INDEPENDENT_AMBULATORY_CARE_PROVIDER_SITE_OTHER): Payer: Self-pay | Admitting: Plastic Surgery

## 2021-06-04 DIAGNOSIS — S01312D Laceration without foreign body of left ear, subsequent encounter: Secondary | ICD-10-CM

## 2021-06-04 NOTE — Progress Notes (Signed)
The left ear was cleaned with chlorhexidine.  The patient marked the area.  Arin piercing was done on the left side.  She had had it repaired after her repair but said that it closed up on her.  No complications.

## 2021-07-12 ENCOUNTER — Other Ambulatory Visit (HOSPITAL_BASED_OUTPATIENT_CLINIC_OR_DEPARTMENT_OTHER): Payer: Self-pay

## 2021-09-02 ENCOUNTER — Ambulatory Visit: Payer: BC Managed Care – PPO

## 2021-10-13 DIAGNOSIS — I10 Essential (primary) hypertension: Secondary | ICD-10-CM | POA: Diagnosis not present

## 2021-10-22 DIAGNOSIS — Z0000 Encounter for general adult medical examination without abnormal findings: Secondary | ICD-10-CM | POA: Diagnosis not present

## 2021-10-22 DIAGNOSIS — I10 Essential (primary) hypertension: Secondary | ICD-10-CM | POA: Diagnosis not present

## 2021-10-22 DIAGNOSIS — R82998 Other abnormal findings in urine: Secondary | ICD-10-CM | POA: Diagnosis not present

## 2021-10-22 DIAGNOSIS — Z1212 Encounter for screening for malignant neoplasm of rectum: Secondary | ICD-10-CM | POA: Diagnosis not present

## 2021-11-29 DIAGNOSIS — R051 Acute cough: Secondary | ICD-10-CM | POA: Diagnosis not present

## 2021-11-29 DIAGNOSIS — R197 Diarrhea, unspecified: Secondary | ICD-10-CM | POA: Diagnosis not present

## 2021-11-29 DIAGNOSIS — J189 Pneumonia, unspecified organism: Secondary | ICD-10-CM | POA: Diagnosis not present

## 2021-12-09 DIAGNOSIS — J189 Pneumonia, unspecified organism: Secondary | ICD-10-CM | POA: Diagnosis not present

## 2022-10-28 DIAGNOSIS — I10 Essential (primary) hypertension: Secondary | ICD-10-CM | POA: Diagnosis not present

## 2022-11-03 DIAGNOSIS — I10 Essential (primary) hypertension: Secondary | ICD-10-CM | POA: Diagnosis not present

## 2022-11-03 DIAGNOSIS — Z0000 Encounter for general adult medical examination without abnormal findings: Secondary | ICD-10-CM | POA: Diagnosis not present

## 2022-11-03 DIAGNOSIS — Z1331 Encounter for screening for depression: Secondary | ICD-10-CM | POA: Diagnosis not present

## 2022-12-02 DIAGNOSIS — R82998 Other abnormal findings in urine: Secondary | ICD-10-CM | POA: Diagnosis not present

## 2022-12-13 DIAGNOSIS — Z1231 Encounter for screening mammogram for malignant neoplasm of breast: Secondary | ICD-10-CM | POA: Diagnosis not present

## 2022-12-13 DIAGNOSIS — Z124 Encounter for screening for malignant neoplasm of cervix: Secondary | ICD-10-CM | POA: Diagnosis not present

## 2022-12-13 DIAGNOSIS — Z01419 Encounter for gynecological examination (general) (routine) without abnormal findings: Secondary | ICD-10-CM | POA: Diagnosis not present

## 2022-12-13 DIAGNOSIS — Z6828 Body mass index (BMI) 28.0-28.9, adult: Secondary | ICD-10-CM | POA: Diagnosis not present

## 2023-01-04 DIAGNOSIS — Z1152 Encounter for screening for COVID-19: Secondary | ICD-10-CM | POA: Diagnosis not present

## 2023-01-04 DIAGNOSIS — R5383 Other fatigue: Secondary | ICD-10-CM | POA: Diagnosis not present

## 2023-01-04 DIAGNOSIS — R0981 Nasal congestion: Secondary | ICD-10-CM | POA: Diagnosis not present

## 2023-01-04 DIAGNOSIS — G43909 Migraine, unspecified, not intractable, without status migrainosus: Secondary | ICD-10-CM | POA: Diagnosis not present

## 2023-01-04 DIAGNOSIS — J101 Influenza due to other identified influenza virus with other respiratory manifestations: Secondary | ICD-10-CM | POA: Diagnosis not present

## 2023-01-04 DIAGNOSIS — J029 Acute pharyngitis, unspecified: Secondary | ICD-10-CM | POA: Diagnosis not present

## 2023-01-04 DIAGNOSIS — I10 Essential (primary) hypertension: Secondary | ICD-10-CM | POA: Diagnosis not present

## 2023-01-04 DIAGNOSIS — R051 Acute cough: Secondary | ICD-10-CM | POA: Diagnosis not present

## 2023-11-27 DIAGNOSIS — Z1212 Encounter for screening for malignant neoplasm of rectum: Secondary | ICD-10-CM | POA: Diagnosis not present

## 2023-11-28 DIAGNOSIS — I10 Essential (primary) hypertension: Secondary | ICD-10-CM | POA: Diagnosis not present

## 2023-12-04 DIAGNOSIS — I10 Essential (primary) hypertension: Secondary | ICD-10-CM | POA: Diagnosis not present

## 2023-12-04 DIAGNOSIS — R82998 Other abnormal findings in urine: Secondary | ICD-10-CM | POA: Diagnosis not present

## 2023-12-04 DIAGNOSIS — Z0000 Encounter for general adult medical examination without abnormal findings: Secondary | ICD-10-CM | POA: Diagnosis not present

## 2024-01-04 DIAGNOSIS — Z1231 Encounter for screening mammogram for malignant neoplasm of breast: Secondary | ICD-10-CM | POA: Diagnosis not present

## 2024-01-04 DIAGNOSIS — Z01419 Encounter for gynecological examination (general) (routine) without abnormal findings: Secondary | ICD-10-CM | POA: Diagnosis not present
